# Patient Record
Sex: Male | Born: 2000 | Race: Black or African American | Hispanic: No | Marital: Single | Smoking: Never smoker
Health system: Southern US, Community
[De-identification: ages and names within clinical notes are randomized; demographics above are authoritative.]

## PROBLEM LIST (undated history)

## (undated) DIAGNOSIS — J302 Other seasonal allergic rhinitis: Secondary | ICD-10-CM

## (undated) DIAGNOSIS — F909 Attention-deficit hyperactivity disorder, unspecified type: Secondary | ICD-10-CM

## (undated) HISTORY — DX: Attention-deficit hyperactivity disorder, unspecified type: F90.9

---
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
## 2022-01-07 ENCOUNTER — Other Ambulatory Visit: Payer: Self-pay

## 2022-01-08 ENCOUNTER — Telehealth: Payer: Self-pay

## 2022-01-08 MED ORDER — FLUOXETINE HCL 20 MG PO TABS: 20.0000 mg | ORAL_TABLET | Freq: Every day | ORAL | 1 refills | Status: DC

## 2022-01-08 NOTE — Telephone Encounter (Signed)
Approval Entry Complete Form HelpConfirmation #:[id number] WPrior Approval #:Status:SUSPENDED

## 2022-01-08 NOTE — Telephone Encounter (Signed)
Restart Prozac 20 mg daily, #30 with 1 RF"s.RX for above e-scribed and sent to pharmacy on record  [id number] - [place], [place] - [address] AT [facility] [address] [place] [address] Phone: [phone] Fax: [phone]

## 2022-01-18 ENCOUNTER — Encounter: Payer: Self-pay | Admitting: Family

## 2022-01-18 ENCOUNTER — Telehealth (INDEPENDENT_AMBULATORY_CARE_PROVIDER_SITE_OTHER): Payer: Medicaid Other | Admitting: Family

## 2022-01-18 DIAGNOSIS — Z7189 Other specified counseling: Secondary | ICD-10-CM

## 2022-01-18 DIAGNOSIS — F1211 Cannabis abuse, in remission: Secondary | ICD-10-CM

## 2022-01-18 DIAGNOSIS — Z79899 Other long term (current) drug therapy: Secondary | ICD-10-CM

## 2022-01-18 DIAGNOSIS — Z719 Counseling, unspecified: Secondary | ICD-10-CM

## 2022-01-18 DIAGNOSIS — Z8659 Personal history of other mental and behavioral disorders: Secondary | ICD-10-CM | POA: Diagnosis not present

## 2022-01-18 DIAGNOSIS — F819 Developmental disorder of scholastic skills, unspecified: Secondary | ICD-10-CM | POA: Diagnosis not present

## 2022-01-18 DIAGNOSIS — F988 Other specified behavioral and emotional disorders with onset usually occurring in childhood and adolescence: Secondary | ICD-10-CM

## 2022-01-18 NOTE — Progress Notes (Signed)
[facility] [address] [place] [place] [address] Dept: [phone] Dept Fax: [phone]  Medication Check visit via Virtual Video   Patient ID:  Stanley Washington  male DOB: 2000-07-21   22 y.o.   MRN: [id number]   DATE:01/18/22  PCP: Stanley Salmon, MD  Virtual Visit via Video Note I connected with  Stanley Washington  and Stanley Washington 's Mother (Name Stanley Washington) on 01/18/22 at  2:00 PM EST by a video enabled telemedicine application and verified that I am speaking with the correct person using two identifiers. Patient/Parent Location: at home and called Stanley Washington at his house separately from the mother.   I discussed the limitations, risks, security and privacy concerns of performing an evaluation and management service by telephone and the availability of in person appointments. I also discussed with the parents that there may be a patient responsible charge related to this service. The parents expressed understanding and agreed to proceed.  Provider: Carron Curie, NP  Location: private work location  HPI/CURRENT STATUS: Stanley Washington is here for medication management of the psychoactive medications for ADHD and review of educational and behavioral concerns.   Stanley Washington currently taking Prozac 20 mg now for 4 days,  which is working well with side effects. Taking the medication at 0800 with food with staying up for about 1 hour and back to sleep until just before work.   Stanley Washington is eating well (eating breakfast, lunch and dinner). Stanley Washington does not have appetite suppression  Sleeping well (goes to bed at 0300 wakes at 0800), sleeping through the night. Stanley Washington does not have delayed sleep onset, but later schedule for work hours.   EDUCATION/WORK: Work: [employer] on [employer]: 5-close  Days: 5 days/week  Activities/ Exercise: intermittently  MEDICAL HISTORY: Individual Medical History/ Review of Systems: None  reported recently.   Has been healthy with no visits to the PCP. WCC due yearly.   Family Medical/ Social History: No major changes reported. Stanley Washington Lives with:  sister and her children  MENTAL HEALTH: Mental Health Issues:   Depression and Anxiety- Just restarted his Prozac 20 mg 4 days ago.   Allergies: No Known Allergies  Current Medications:  Current Outpatient Medications  Medication Instructions   cetirizine (ZYRTEC) 10 mg, Daily   FLUoxetine (PROZAC) 20 mg, Oral, Daily   Medication Side Effects: None  DIAGNOSES:    ICD-10-CM   1. 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 History of anxiety  Z86.59     3. History of depression  Z86.59     4. History of cannabis abuse  F12.11     5. Learning difficulty  F81.9     6. Medication management  Z79.899     7. Patient counseled  Z71.9     8. Goals of care, counseling/discussion  Z71.89     ASSESSMENT:  Stanley Washington is a 21 year old male with a history of ADHD and Anxiety. He has been off of medication and restarted Prozac 20 mg daily for the past 4 days. Had reported more anxiety throughout the day that he was not able to control. Not currently in counseling for emotional regulation. Changed jobs recently and now working full time hours at [employer].  Reports no issues with his work or demands of the job. Sleep schedule has changed to later hours due to working fro 1700 until 0200-0300 5 days/week. NO health changes since last visit.  Eating well with no reported changes and getting some activity. Discussed continuation of medication and need for counseling services.   PLAN/RECOMMENDATIONS:  Updates with recent start of medication for anxiety in the past 4 days.  Discussed work changes and schedule shift with his new job.   Mother and sister reported no significant changes, but discussed the type of medication and therapeutic levels.   Discussed the need for counseling to assist with his ADHD and  emotional regulation.   List of counselors to be sent to mother via email for appoint to schedule with provider.   Sleep habits and sleep schedule has changed due to his work schedule.   Medication management with current dose discussed with patient with continuation at the same dose.   Counseled medication pharmacokinetics, options, dosage, administration, desired effects, and possible side effects.   Prozac 20 mg daily, no Rx today   I discussed the assessment and treatment plan with Stanley Washington & parent. Stanley Washington & parent was provided an opportunity to ask questions and all were answered. Stanley Washington & parent agreed with the plan and demonstrated an understanding of the instructions.  REVIEW OF CHART, FACE TO FACE CLINIC TIME AND DOCUMENTATION TIME DURING TODAY'S VISIT:  48 mins      NEXT APPOINTMENT:  Need f/u visit in the next 3-4 months for medication management.  The patient & parent was advised to call back or seek an in-person evaluation if the symptoms worsen or if the condition fails to improve as anticipated.   Carron Curie, NP

## 2022-04-01 ENCOUNTER — Other Ambulatory Visit: Payer: Self-pay | Admitting: Family

## 2022-04-01 NOTE — Telephone Encounter (Signed)
Prozac 20 mg daily #30 with 3 RF's.RX for above e-scribed and sent to pharmacy on record  [id number] [place],  - [id number] [id number]: [phone]: [phone]
